# Patient Record
Sex: Male | Born: 1985 | Race: Black or African American | Hispanic: No | Marital: Single | Smoking: Current every day smoker
Health system: Southern US, Community
[De-identification: ages and names within clinical notes are randomized; demographics above are authoritative.]

## PROBLEM LIST (undated history)

## (undated) DIAGNOSIS — C801 Malignant (primary) neoplasm, unspecified: Secondary | ICD-10-CM

## (undated) DIAGNOSIS — N289 Disorder of kidney and ureter, unspecified: Secondary | ICD-10-CM

## (undated) HISTORY — PX: BACK SURGERY: SHX140

---
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
## 2005-01-16 ENCOUNTER — Encounter: Payer: Self-pay | Admitting: Emergency Medicine

## 2005-01-16 ENCOUNTER — Ambulatory Visit: Payer: Self-pay | Admitting: Physical Medicine & Rehabilitation

## 2005-01-16 ENCOUNTER — Inpatient Hospital Stay (HOSPITAL_COMMUNITY): Admission: AD | Admit: 2005-01-16 | Discharge: 2005-01-22 | Payer: Self-pay | Admitting: Neurosurgery

## 2005-01-16 ENCOUNTER — Ambulatory Visit: Payer: Self-pay | Admitting: Oncology

## 2005-01-18 ENCOUNTER — Encounter (INDEPENDENT_AMBULATORY_CARE_PROVIDER_SITE_OTHER): Payer: Self-pay | Admitting: *Deleted

## 2005-01-22 ENCOUNTER — Inpatient Hospital Stay (HOSPITAL_COMMUNITY)
Admission: RE | Admit: 2005-01-22 | Discharge: 2005-01-26 | Payer: Self-pay | Admitting: Physical Medicine & Rehabilitation

## 2005-01-25 ENCOUNTER — Ambulatory Visit: Payer: Self-pay | Admitting: Oncology

## 2005-02-09 ENCOUNTER — Ambulatory Visit: Admission: RE | Admit: 2005-02-09 | Discharge: 2005-03-03 | Payer: Self-pay | Admitting: Radiation Oncology

## 2005-12-11 IMAGING — CR DG CHEST 2V
2 series · 2 of 2 positions shown · non-contrast
Comparison: none

CLINICAL DATA: Spinal mass and cord compression.  Now complains of numbness in legs.
 TWO VIEWS OF THE CHEST:

[view not recorded (1 of 2)]
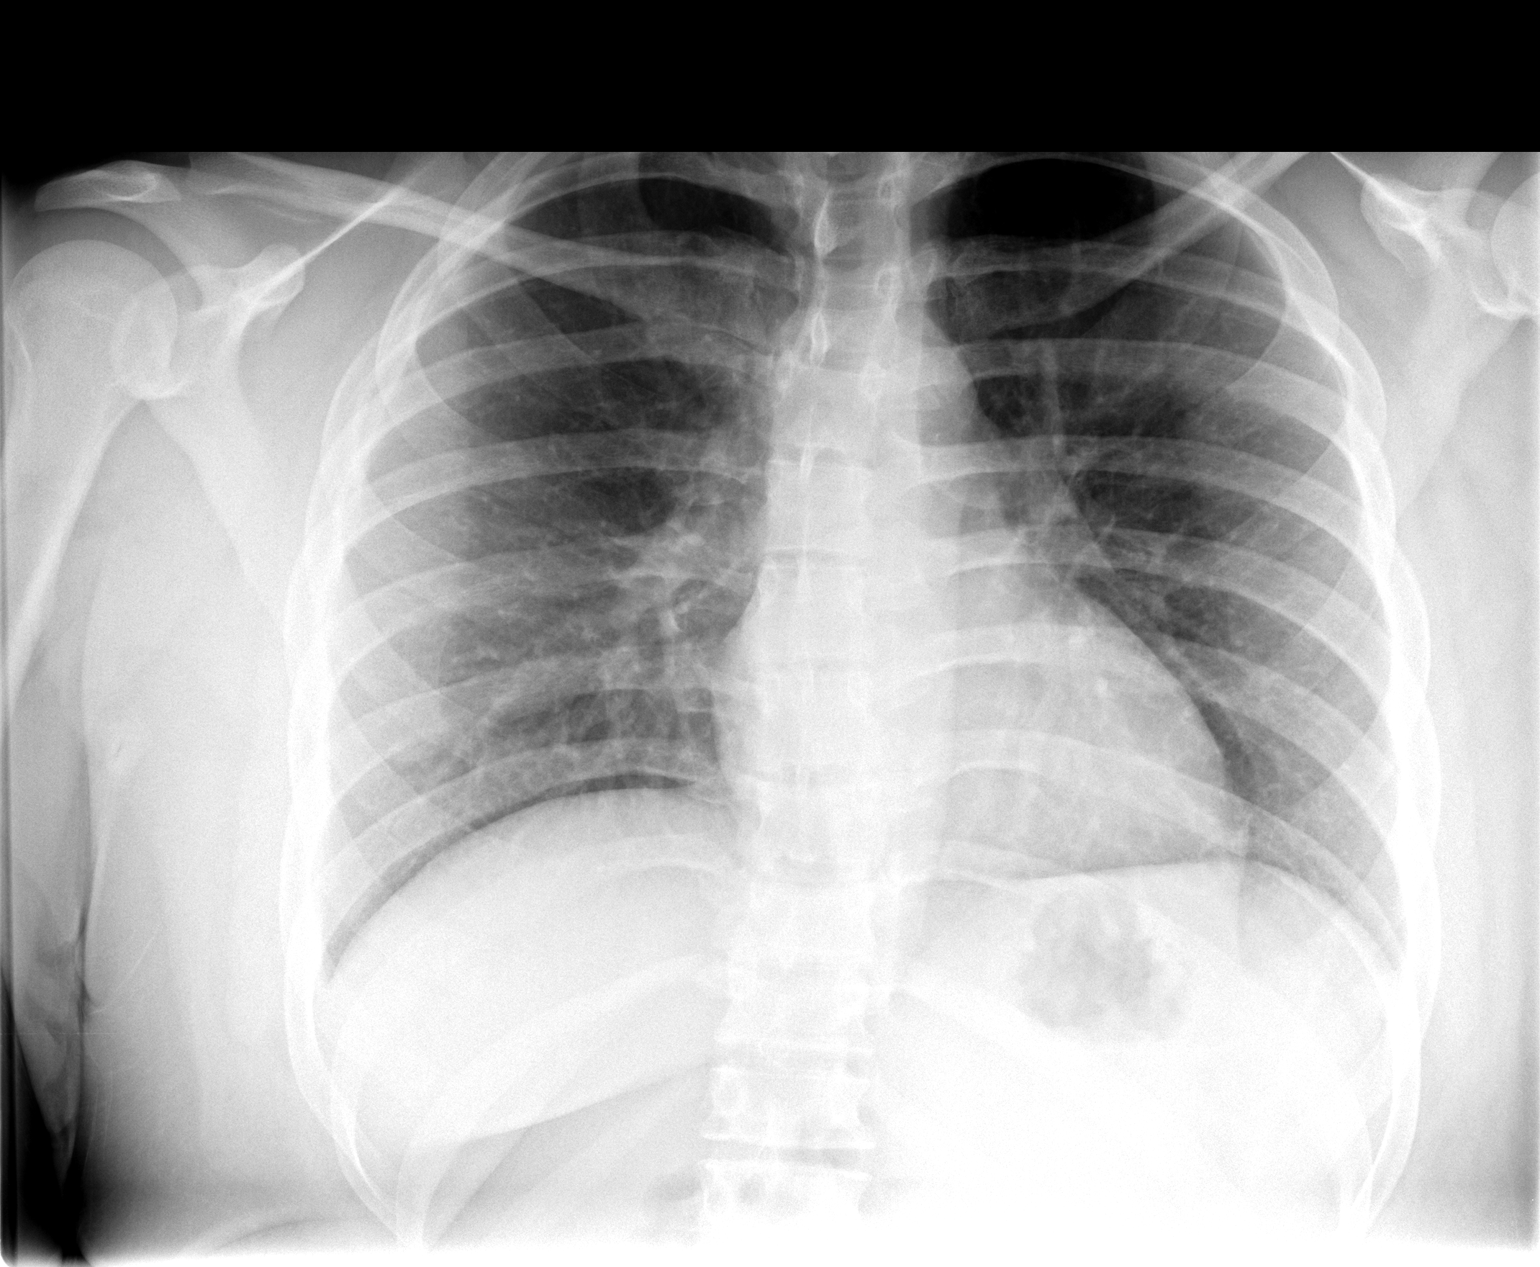

[view not recorded (2 of 2)]
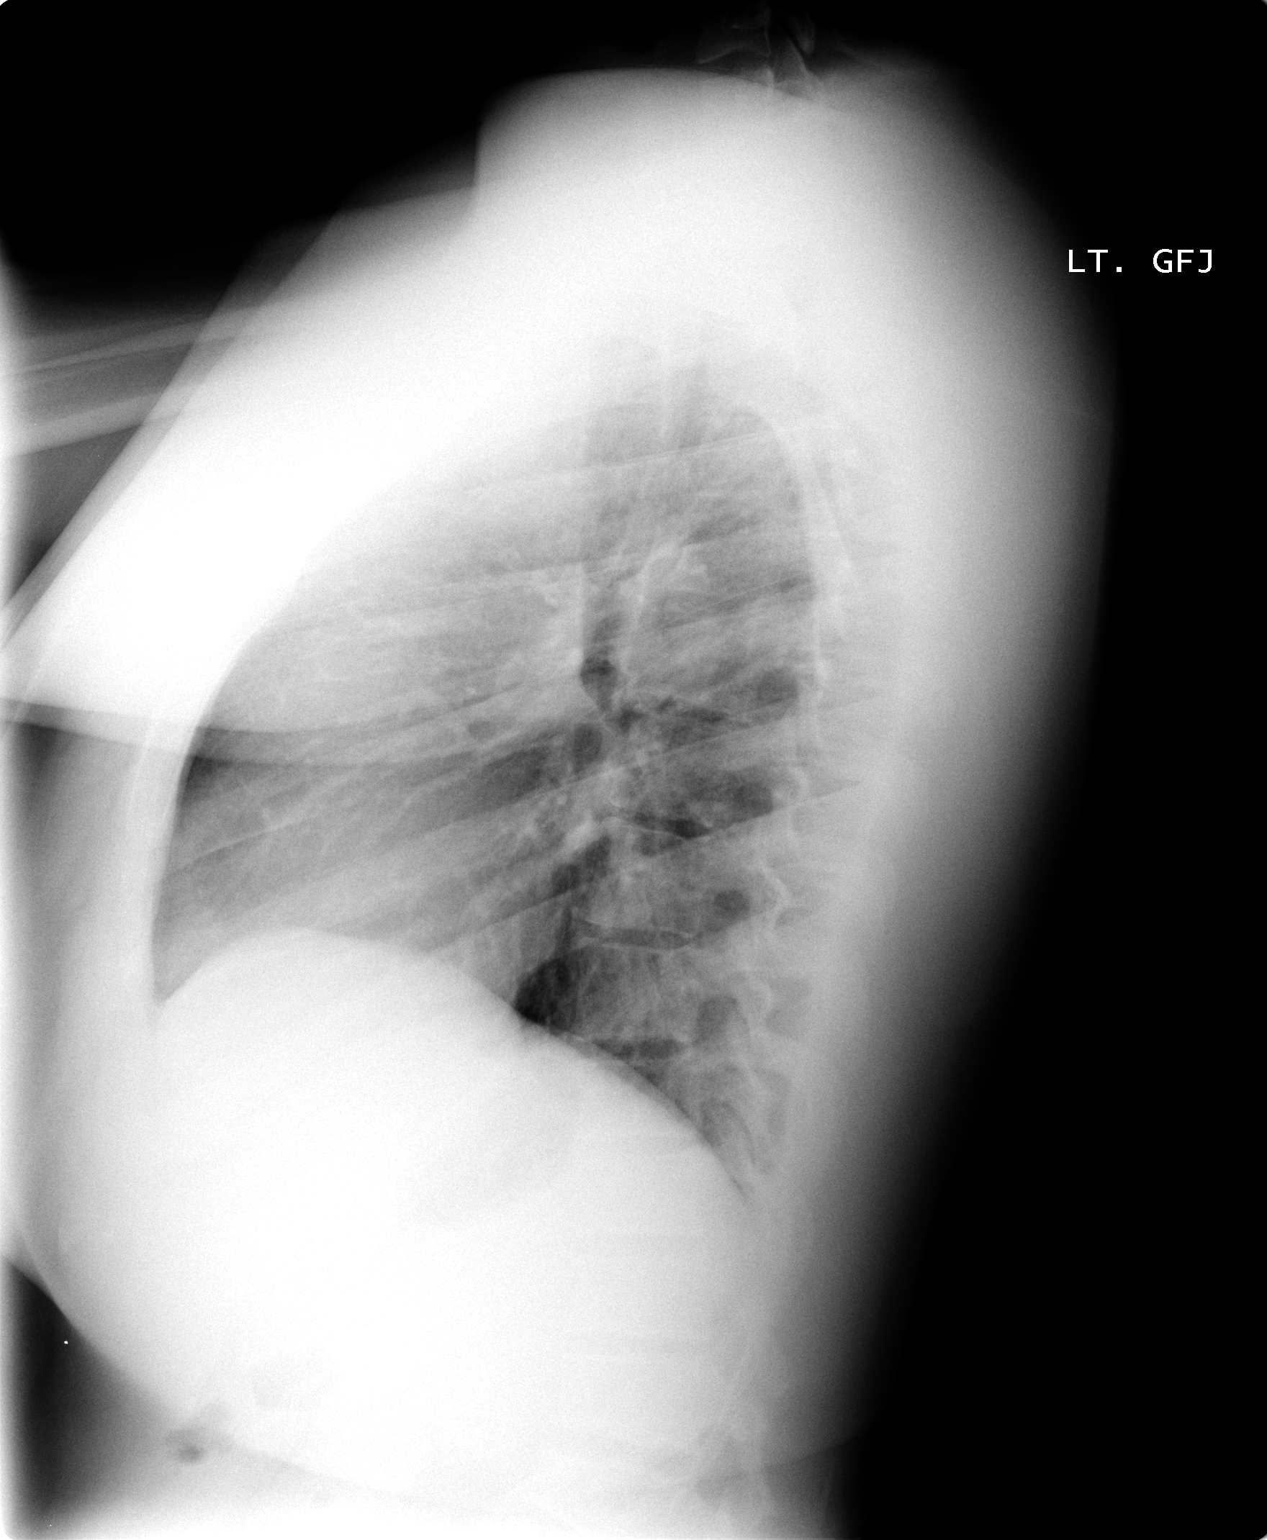

[2 of 2 positions shown; findings below may reference images not displayed]

FINDINGS: There are no focal lung opacities.  
 The heart size is normal.  No pleural effusions or pneumothorax.
IMPRESSION: Normal chest.

## 2009-01-18 ENCOUNTER — Emergency Department (HOSPITAL_COMMUNITY): Admission: EM | Admit: 2009-01-18 | Discharge: 2009-01-18 | Payer: Self-pay | Admitting: Emergency Medicine

## 2009-10-20 ENCOUNTER — Emergency Department (HOSPITAL_COMMUNITY): Admission: EM | Admit: 2009-10-20 | Discharge: 2009-10-20 | Payer: Self-pay | Admitting: Emergency Medicine

## 2011-03-09 LAB — URINALYSIS, ROUTINE W REFLEX MICROSCOPIC
Bilirubin Urine: NEGATIVE
Glucose, UA: NEGATIVE mg/dL
Hgb urine dipstick: NEGATIVE
Ketones, ur: NEGATIVE mg/dL
Nitrite: NEGATIVE
Protein, ur: NEGATIVE mg/dL
Specific Gravity, Urine: 1.021 (ref 1.005–1.030)
Urobilinogen, UA: 1 mg/dL (ref 0.0–1.0)
pH: 6.5 (ref 5.0–8.0)

## 2011-04-09 NOTE — Discharge Summary (Signed)
[name], [name]             ACCOUNT NO.:  [id number]   MEDICAL RECORD NO.:  [id number]          PATIENT TYPE:  INP   LOCATION:  3004                         FACILITY:  [facility]   PHYSICIAN:  Clydene Fake, M.D.  DATE OF BIRTH:  1986/06/26   DATE OF ADMISSION:  01/16/2005  DATE OF DISCHARGE:  01/22/2005                                 DISCHARGE SUMMARY   ADMISSION DIAGNOSIS:  Spinal mass.   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   PROCEDURES:  T10-L1 laminectomy and resection of epidural tumor and biopsy  and microdissection with microscope.   REASON FOR ADMISSION:  The patient is a 25 year old gentleman who is said to  have progressive back pain and leg numbness and weakness during walking.  He  went to the emergency room.  MRI demonstrated a large paraspinous mass from  T10 to L1 through the foramen into the epidural space compressing the spinal  cord  The patient was admitted and started on steroids and underwent surgery  on January 18, 2005, to decompress the cord, biopsy of tumor.  Surgery went  without complications.  Postop, the patient was sent to the recovery room  and intensive care unit and was watched.  He had no headaches.  Incision was  clean, dry and intact.  He showed grossly 5/5 strength in all motor groups  with rapid alternating movements down the legs pretty well.  Sensation  grossly intact which seems to be an improvement from preop.  Transferred  from ICU on February 28, and got Oncology and Radiation/Oncology consults.  First, we started getting the patient up out of bed.  Started moving around.  PT was consulted to work with him at assisting with that.  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  The patient was ambulating by March 2, and this was slightly  better than prior surgery, but still slightly spastic, but sensory intact,  and that is a great  improvement from preoperative.  Oncologist was going to  get records from [facility] where the patient was treated for a germ  cell tumor around the kidney when he was 25 years of age, and try to compare  pathology and consider further course of treatment when the final pathology  is back.  Rehabilitation was consulted, and they agreed the patient would be  a good rehab candidate  Incision is intact.  He is starting to do well, and  he was discharged from the hospital and transferred to rehabilitation on  January 22, 2005, in stable condition.  We will follow him while there.  His  outpatient oncologist will follow the patient.      JRH/MEDQ  D:  03/25/2005  T:  03/25/2005  Job:  [id number]

## 2011-04-09 NOTE — Consult Note (Signed)
[name], LAW             ACCOUNT NO.:  [id number]   MEDICAL RECORD NO.:  [id number]          PATIENT TYPE:  INP   LOCATION:  3004                         FACILITY:  [facility]   PHYSICIAN:  [name], M.D. DATE OF BIRTH:  1986-02-17   DATE OF CONSULTATION:  01/19/2005  DATE OF DISCHARGE:                                   CONSULTATION   REASON FOR CONSULT:  Rule out recurrent germ cell tumor.   HISTORY OF PRESENT ILLNESS:  [name] is a pleasant 25 year old, African-  American male with a history of germ cell tumor diagnosed at age 25 requiring  radiation and chemotherapy to the area at [facility], without recurrence  since that time until possibly now.  He presented to the ED with a several-  month history of back pain, worse over the last couple of weeks, and on  admission with progressive bilateral lower extremity weakness.  MRI of the  TL spine with and without contrast demonstrated a mass at the right  paraspinous area at T11-T12, measuring 3 x 3 cm, extending into the spinal  canal, with marked compression of the conus medullaris.  No bony invasion  identified.  He underwent a laminectomy - tumor resection on January 18, 2005 by Dr. Phoebe Perch.  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   PAST MEDICAL HISTORY:  1.  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  2.  Scoliosis.  3.  History of tobacco habituation.  4.  New onset of cord compression secondary to mass at the right paraspinous      area, prior to resection.   SURGERY:  Status post T10-L1 laminectomy and resection of epidural tumor by  Dr. Phoebe Perch, January 18, 2005.   ALLERGIES:  NKDA.   CURRENT MEDICATIONS:  1.  Cefazolin 1 g IV q.8 h.  2.  Decadron 2 mg p.o. q.6 h. and tapering.  3.  Colace b.i.d.  4.  Pepcid 20 mg b.i.d.  5.  Robaxin 500 mg q.6 h.  6.   Morphine sulfate 30 mg q.4 h. PCA.  7.  Vitamin E 800 mg b.i.d.  8.  Lortab q.4 h. p.r.n.   P.R.N. MEDICATIONS:  1.  Reglan.  2.  Narcan.  3.  Zofran.  4.  Senokot.  5.  Compazine.   REVIEW OF SYSTEMS:  See HPI for significant positives.  At this time, other  than bilateral lower extremity weakness, which is improved, and resolving  numbness and tingling, the rest of the review of systems is essentially  negative.   FAMILY HISTORY:  Mother and father both alive and well.  He has no sisters.  He has two brothers in good health.   SOCIAL HISTORY:  The patient is single.  He has no children.  He is a  Consulting civil engineer at [employer], and he also works at [employer].  He smokes one to two  cigarettes a day for the last couple of years.  No  alcohol history.  He  lives in [place].   PHYSICAL EXAMINATION:  GENERAL:  This is a moderately obese 25 year old,  African-American male in no acute distress.  Alert and oriented x 3.  He is  lying on the left side of the bed.  BLOOD PRESSURE:  120/50.  PULSE:  40.  RESPIRATIONS:  20.  TEMPERATURE:  98.  HEENT:  Normocephalic, atraumatic.  PERRLA.  Oral mucosa without thrush or  lesions.  NECK:  Supple.  No JVD.  No cervical or supraclavicular masses.  CARDIOVASCULAR:  Regular rate and rhythm, without murmurs, rubs, or gallops.  ABDOMEN:  Soft, nontender.  Bowel sounds x 4.  No palpable spleen or liver.  GENITOURINARY/RECTAL:  Testes without masses.  There is a urinary catheter  in place.  Urine clear.  EXTREMITIES:  With no clubbing or cyanosis, no edema.  SKIN:  Reveals no lesions, although very dry.  No bruising or petechiae.  NEUROLOGIC:  Other than improving numbness and tingling, as mentioned above,  exam as per neurosurgery.  Appears improving symptoms.  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   LABORATORIES:  Hemoglobin 14.5, hematocrit 42.5, white count 8.9, platelets  167, MCV 81.2.  PT 12.7, PTT 30,  INR 0.9.  Sodium 134, potassium 3.9, BUN  12, creatinine 0.9, glucose 91, total bilirubin 0.6, alkaline phosphatase  79, AST 33, ALT 41, total protein 7.1, albumin 3.8, calcium 9.4.  Urinalysis  shows proteinuria as of January 16, 2005, greater than 300.  He also has  shown in that urinalysis moderate hematuria.  Alpha fetoprotein pending.   ASSESSMENT AND PLAN:  Dr. Truett Yoder has seen and evaluated the patient, and  the chart has been reviewed.   IMPRESSION:  1.  Right paraspinous mass with cord compression, status post laminectomy -      tumor resection.  2.  Bilateral lower extremity weakness - numbness, improved.  3.  Remote history of right kidney tumor at age 25, treated at [facility].  4.  History of tobacco use.   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  We will follow up  on the final pathology and then make treatment recommendations.   RECOMMENDATIONS:  1.  Radiation oncology consult.  2.  Staging CT scans of the brain, chest, abdomen, and pelvis.  3.  Alpha fetoprotein, beta HCG.  4.  Postoperative care, Decadron taper per neurosurgery.  5.  Treatment recommendations to following pending the final pathology.      Staging evaluation.  6.  Obtain records from [facility] as soon as possible.   Thank you very much for allowing [place] the opportunity to participate in the  care of [name].      SW/MEDQ  D:  01/20/2005  T:  01/20/2005  Job:  [id number]

## 2011-04-09 NOTE — Discharge Summary (Signed)
[name], [name]             ACCOUNT NO.:  [id number]   MEDICAL RECORD NO.:  [id number]          PATIENT TYPE:  IPS   LOCATION:  4036                         FACILITY:  [facility]   PHYSICIAN:  Erick Colace, M.D.DATE OF BIRTH:  1986-07-19   DATE OF ADMISSION:  01/22/2005  DATE OF DISCHARGE:  01/26/2005                                 DISCHARGE SUMMARY   DISCHARGE DIAGNOSES:  1.  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  2.  Pain control.  3.  History of germ cell tumor age 74 right kidney with chemotherapy therapy,      radiation therapy.  4.  Tobacco abuse.   HISTORY OF PRESENT ILLNESS:  A 25 year old black male.  History of germ cell  tumor right kidney age 74 treated with chemotherapy and radiation.  Admitted  February 25 with back pain, bilateral leg weakness.  MRI February 24 mass  right paraspinous area T10-L1 through the foramen to the central canal.  Underwent T10-L1 laminectomy epidural tumor resection February 27 per Dr.  Phoebe Perch.  Hematology/oncology consulted Dr. Truett Perna.  Pathology report  impression of recurrent germ cell tumor.  Plan was to discuss care with [facility] Dr. Doylene Canning to decide course of care.  He had no bowel or bladder  disturbances.  He was admitted for comprehensive rehabilitation program.   PAST MEDICAL HISTORY:  See discharge diagnoses.   ALLERGIES:  None.   SOCIAL HISTORY:  He does have a history of tobacco use.  Denies alcohol.   MEDICATIONS PRIOR TO ADMISSION:  None.   PAST MEDICAL HISTORY:  Negative other than discharge diagnoses.   HOSPITAL COURSE:  Patient with progressive gains while on rehabilitation  services with therapies initiated on a b.i.d. basis.  The following issues  were followed during patient's rehabilitation course. Pertaining to Mr.  Makela recurrent germ cell tumor, he had undergone T10-L1 laminectomy  with tumor resection February 27.  Surgical site  healing nicely.  Sutures in  place.  He would follow up with Dr. Phoebe Perch.  He remained afebrile.  Pain  control ongoing with the use of Vicodin and good results.  He was  independent, walking throughout the rehabilitation unit.  His strength was  grossly graded a 4+/5.  Oncology had been contacted per Dr. Truett Perna to [facility], Dr. Doylene Canning who was the patient's primary oncologist from history of  germ cell tumor at age 74.  Plan was to follow up Dr. Doylene Canning on February 01, 2005  to decide plan of care.  He would follow up Dr. Mancel Bale on March 17 to  carry out his treatment as recommended per Dr. Doylene Canning in [place].  During  his rehabilitation stay he had no bowel or bladder disturbances.  Blood  pressures controlled.  He did have a history of tobacco abuse.  He had not  requested any nicotine products.  It was discussed at length the need for  cessation of smoking.   LABORATORIES:  Sodium 136, potassium 4.1, BUN 13, creatinine 0.8, hemoglobin  14.3, hematocrit 41.6.  DISCHARGE MEDICATIONS:  1.  Vicodin as needed pain.  2.  Vitamin E 800 units twice daily.   ACTIVITY:  As tolerated.   DIET:  Regular.   SPECIAL INSTRUCTIONS:  Follow up Dr. Doylene Canning [facility] oncology March 13,  Dr. Mancel Bale oncology services [facility] March 17 to carry  out plan of care for recurrent germ cell tumor.      DA/MEDQ  D:  01/26/2005  T:  01/26/2005  Job:  [id number]   cc:   Clydene Fake, M.D.  [address]  [place]  [place] [address]  Fax: [phone]   Leighton Roach. Truett Perna, M.D.  [address] [facility]  [place]  [place] [address]  Fax: [phone]

## 2011-04-09 NOTE — H&P (Signed)
[name], [name]             ACCOUNT NO.:  [id number]   MEDICAL RECORD NO.:  [id number]          PATIENT TYPE:  INP   LOCATION:  3003                         FACILITY:  [facility]   PHYSICIAN:  [name], M.D.  DATE OF BIRTH:  12/24/1985   DATE OF ADMISSION:  01/16/2005  DATE OF DISCHARGE:                                HISTORY & PHYSICAL   CHIEF COMPLAINT:  Back pain, leg weakness.   HISTORY:  The patient is a 25 year old gentleman who had some back pain in  hind site now over the last few months but it has been worsening over the  last couple of weeks. It has been more of a problem and he has had  associated progressive weakness in his legs with trouble walking, and some  numbness and tingling in his legs, right side revealed to be a little worse  than the left but both are involved.  He now presents to the emergency room  on January 15, 2005 and worked up with MRI of the thoracolumbar spine with  and without contrast and found to have a mass in the right paraspinous area  going through the foramen into the central canal, progressed through the  spinal cord. The patient's history is significant for a germ cell tumor  starting in the right kidney when he was 69-years-old which was treated with  chemotherapy and radiation. No surgery though I assume they must have done  some kind of biopsy.  Again, that was 17 years ago. It was felt that his  right kidney dissolved during all of this treatment.  No bowel or bladder  changes.   PAST MEDICAL HISTORY:  Significant for germ cell tumor in the right kidney  at age 11 as mentioned above, otherwise negative.   PAST SURGICAL HISTORY:  None.   ALLERGIES:  No known drug allergies.   MEDICATIONS:  Advil or Tylenol p.r.n.   SOCIAL HISTORY:  Shows he is 33, lives with parents. He is a positive  smoker. No drug or alcohol use.   FAMILY HISTORY:  Noncontributory.   REVIEW OF SYSTEMS:  Negative for any other problems.   PHYSICAL  EXAMINATION:  HEENT:  Unremarkable, normal. Normocephalic.  NECK:  Supple, nontender.  CHEST:  Clear.  ABDOMEN:  Soft, nontender.  EXTREMITIES:  Intact, no edema.  NEURO EXAM:  He is awake, alert and oriented x3. Appropriate affect. Normal  fundi.  Cranial nerves II-XII are examined and are intact. Motor strength and  sensation of upper extremities along with RFL movements are done well and  with no pronator drift. The lower extremities have 2/4 reflexes, equal at  the knees and ankles. No clonus. Toes are equivocal but sensation is  decreased in the entire legs. There is a sensory level on the right up  around T12 or so, on the left, at 1 out of 2, just slightly lower. Gait is  done on a wide base, spastic. He cannot tandem gait. Wrap around movements  are slowed in the lower extremities.   DATA REVIEW:  MRI: Shows an enhancing mass adjacent to the paraspinal space  on the right, anterior and posterior to the rib with this mass extending  into the foramen at T11-12, also possibly at 10-11 and to the central canal.  A large tumor mass in the canal compressing on the spinal cord significantly  to the left with this mass taking up about 90% of the canal space.  Extends  from 10 to T12. All these structures seem to be intact, this mass surrounds  them.   ASSESSMENT/PLAN:  History of germ cell tumor in right kidney as a child and  now very close to that area this new mass that has now extended into the  central canal compressing the spinal cord.   The patient is going to be admitted, placed on some Decadron p.o. medication  p.r.n. Again, preop work up and labs. Will consult oncology. Will try to get  records from [facility] but this patient is going to need surgical  intervention. He is going to  possibly need resection of this tumor in the  paraspinal space.  I am going to discuss the plan with oncology so we have  an idea of high aggressive we may need to be in surgical resection of  this.  As we finalize our plan we will continue discussion with the patient and  family.  The patient agrees with the plan.      JRH/MEDQ  D:  01/16/2005  T:  01/16/2005  Job:  [id number]

## 2011-04-09 NOTE — H&P (Signed)
NAMEJAYVIAN, [name]             ACCOUNT NO.:  [id number]   MEDICAL RECORD NO.:  [id number]          PATIENT TYPE:  IPS   LOCATION:  4036                         FACILITY:  [facility]   PHYSICIAN:  Erick Colace, M.D.DATE OF BIRTH:  1986-03-12   DATE OF ADMISSION:  01/22/2005  DATE OF DISCHARGE:                                HISTORY & PHYSICAL   CHIEF COMPLAINT:  Weakness in the legs.   A 25 year old black male with a history of germ cell tumor in right kidney  at age 15, treated with chemo and radiation therapy.  He was admitted, on  January 16, 2005, with back pain and bilateral lower extremity weakness.  MRI demonstrated a mass in the right paraspinous area T10-L1 extending  through the neuroforamen of T11-T12 and expanding epidural space-occupying  lesion in the conus medullares area.  He underwent a T10-L1 laminectomy and  epidural tumor resection per Dr. Phoebe Perch on January 18, 2005. Hem/onc Dr.  Truett Perna is active on the case and the plan is for chemotherapy plus  probable radiation therapy.  Further workup hinges upon path report.  The  patient denies any bowel or bladder problems.   REVIEW OF SYSTEMS:  Positive for weakness in the lower extremities, lumbago.  No GI disturbance.  No respiratory problems.   PAST MEDICAL HISTORY:  As noted above, germ cell tumor age 15 right kidney.   SOCIAL HISTORY:  He lives with his mother in [place], was employed at  [employer].  Family works during the day, one level home, two steps to enter,  positive history of tobacco, negative for ETOH.   FUNCTIONAL HISTORY:  Independent prior to admission.   CURRENT FUNCTIONAL STATUS:  Min assist for bed mobility, mod assist  transfers, min guard 350 feet with rolling walker.   MEDICATIONS PRIOR TO ADMISSION:  None.   CURRENT MEDICATIONS:  1.  Pepcid 20 mg b.i.d.  2.  Vitamin E 1800 units b.i.d.  3.  Colace 100 mg b.i.d.  4.  Robaxin 500 p.o. q.6h. p.r.n.  5.  Vicodin 1-2 p.o. q.4h.  p.r.n.  6.  Tylenol p.r.n.  7.  Trazodone q.h.s. p.r.n.   ALLERGIES:  None.   LAST LABS:  Hemoglobin 14.5, platelets 167,000, white count 8.9.  BUN 12,  creatinine 0.9, sodium 134, potassium 3.9.   PHYSICAL EXAMINATION:  VITAL SIGNS:  Blood pressure 120/70, pulse 80, temp  98, respirations 18.  GENERAL:  Obese, young male in no acute distress.  EYES:  Not injected.  Extraocular muscles intact.  NECK:  Supple without adenopathy.  EARS:  Hearing is intact.  LUNGS:  Clear.  HEART:  Regular rate and rhythm.  BACK:  A long midline incision closed with sutures, clean, no erythema, no  drainage.  EXTREMITIES:  Lower extremity pulses are normal.  No clubbing, cyanosis, or  edema.  He has 3-/5 bilateral hip flexor strength, otherwise 5/5 bilateral  upper extremities, 5/5 bilateral quads and ankle dorsiflexors.  Sensation is  intact in bilateral upper and lower extremities for light touch.  ABDOMEN:  Positive bowel sounds.  Soft, nontender palpation.  NEUROLOGIC:  Oriented x 3.  Memory appears intact.  Mood and affect are  appropriate.   IMPRESSION:  1.  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  2.  History of germ cell tumor.   PAIN MANAGEMENT:  Continue Vicodin and Robaxin,  may need to add a longer-  acting medicine such as OxyContin if needed.   DEEP VEIN THROMBOSIS PROPHYLAXIS:  Knee high TED's.  We will add foot pumps.   ESTIMATED LENGTH OF STAY:  Five to seven days.   The patient is a good rehab candidate.  Prognosis for functional improvement  is good at least in the short term, also hinges upon treatment of underlying  epidural mass.      AEK/MEDQ  D:  01/22/2005  T:  01/22/2005  Job:  [id number]   cc:   Clydene Fake, M.D.  [address]  [place]  [place] [address]  Fax: [phone]   Leighton Roach. Truett Perna, M.D.  [address] [facility]  [place]  [place] [address]  Fax: [phone]

## 2011-04-09 NOTE — Op Note (Signed)
NAMEFACUNDO, [name]             ACCOUNT NO.:  [id number]   MEDICAL RECORD NO.:  [id number]          PATIENT TYPE:  INP   LOCATION:  3172                         FACILITY:  [facility]   PHYSICIAN:  Clydene Fake, M.D.  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   POSTOPERATIVE DIAGNOSIS:  Thoracic tumor with cord compression and  myelopathy.   PROCEDURE:  T10-L1 laminectomy and resection of epidural tumor,  microdissection with microscope.   SURGEON:  Clydene Fake, M.D.   ASSISTANT:  Elder.   ANESTHESIA:  General endotracheal tube anesthesia.   ESTIMATED BLOOD LOSS:  250 mL.   BLOOD GIVEN:  None.   DRAINS:  None.   COMPLICATIONS:  None.   SPECIMENS:  Tumor sent for frozen and permanent.  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   REASON FOR PROCEDURE:  The patient is a 25 year old gentleman who was sent  for progressive back pain and leg numbness and weakness and trouble walking,  who when he was 25 years of age had a tumor around the right kidney.  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  MRI was done and it was back and found to have a large  paraspinous mass from T10 to L1 that invaded through the foramen into the  epidural space, compressing the spinal cord, worse at T11-12, and the  patient brought in for a laminectomy and decompression and tumor resection.   PROCEDURE IN DETAIL:  The patient was brought in the operating room and  general anesthesia was induced.  The patient was placed in a prone position  on a Wilson frame with all pressure points padded.  The patient was prepped  and draped in sterile fashion.  The site of incision was injected with 20 mL  1% lidocaine with epinephrine.  An incision was then made in the midline of  the thoracolumbar spine and  the incision taken down to the spinous process.  Subperiosteal dissection was done on the right side over three spinous  processes.  Markers were placed in two interspaces, thought to be T11-12 and  T12-L1 and x-rays obtained confirming our position.  The markers were at  those levels.  We then extended our incision just slightly cephalad and  caudally and did a bilateral subperiosteal dissection over T10, T11, T12 and  L1 spinous processes and laminae out to the facets on the right side,  carried the dissection out to the L1 transverse process and the T12 and T11  ribs.  We then performed a laminectomy using Leksell rongeurs, high-speed  drill and then Kerrison punches, removing the bottom of T12, the T11 spinous  process and lamina, T12 and the top of L1.  Dissection was taken a little  further laterally on the right side.  Once we had the central decompression,  we could see the tumor centered at T11, compressing the dura and cord.  It  was pushing it to the right.  We worked on decompression using pituitary  rongeurs, bipolar cauterization and the CUSA to remove tumor.  We kept  hemostasis in the capsule with bipolar cauterization.  We were able to  centrally decompress and then bring the capsule up and remove tumor,  decompressing the central canal in this manner.  We never did appreciate a  nerve root coming out of the T11-12 foramen.  There was a root coming out of  the T10-11 foramen, but tumor was stuck to the root entry zone and when  trying to remove the tumor, a small dural rent occurred.  This was repaired  primarily with 6-0 Prolene suture.  Because the tumor was stuck to this  nerve root as it was coming out the foramen, the nerve root was sacrificed  using a suture and bipolar cauterization, and this allowed [place] to  appropriately decompress the right side of the canal.  We made our  dissection down inferiorly also, where it appears that we had good central  decompression.   The canal had the tumor removed.  The dura and spinal cord  still were positioned toward the left side of the canal, though there was no  pressure.  The cord was pulsatile at this point.  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  It was  felt safer that now that we have diagnosis and the spinal cord decompressed  to stop and not do any further decompression or tumor resection.  Hemostasis  was obtained with Gelfoam and thrombin.  Some Tisseel tissue glue was placed  over the sutured dural rent, but prior to that Valsalva maneuver was done  showing no CSF leak from that area.  Hemostasis was obtained with the  bipolar Bovie cauterization and the paraspinous muscles and then the fascia  closed with 0 Vicryl interrupted suture, the subcutaneous tissue closed with  0, 2-0 and 3-0 Vicryl interrupted suture, and the skin closed with a running  3-0 nylon suture.  When that was done, a dressing was placed.  The patient  was placed back to a supine position, awoken from anesthesia, and  transferred to the recovery room in stable condition.      JRH/MEDQ  D:  01/18/2005  T:  01/18/2005  Job:  [id number]

## 2013-04-02 ENCOUNTER — Emergency Department (INDEPENDENT_AMBULATORY_CARE_PROVIDER_SITE_OTHER)
Admission: EM | Admit: 2013-04-02 | Discharge: 2013-04-02 | Disposition: A | Discharge status: Home / Self Care | Payer: Medicare Other | Source: Home / Self Care | Attending: Family Medicine | Admitting: Family Medicine

## 2013-04-02 ENCOUNTER — Encounter (HOSPITAL_COMMUNITY): Payer: Self-pay | Admitting: Emergency Medicine

## 2013-04-02 DIAGNOSIS — N342 Other urethritis: Secondary | ICD-10-CM

## 2013-04-02 DIAGNOSIS — Z113 Encounter for screening for infections with a predominantly sexual mode of transmission: Secondary | ICD-10-CM | POA: Insufficient documentation

## 2013-04-02 HISTORY — DX: Disorder of kidney and ureter, unspecified: N28.9

## 2013-04-02 HISTORY — DX: Malignant (primary) neoplasm, unspecified: C80.1

## 2013-04-02 LAB — POCT URINALYSIS DIP (DEVICE)
Bilirubin Urine: NEGATIVE
Glucose, UA: NEGATIVE mg/dL
Ketones, ur: NEGATIVE mg/dL
Nitrite: NEGATIVE
Protein, ur: 100 mg/dL — AB
Specific Gravity, Urine: 1.025 (ref 1.005–1.030)
Urobilinogen, UA: 0.2 mg/dL (ref 0.0–1.0)
pH: 6.5 (ref 5.0–8.0)

## 2013-04-02 MED ORDER — LIDOCAINE HCL (PF) 1 % IJ SOLN
INTRAMUSCULAR | Status: AC
  Filled 2013-04-02: qty 5

## 2013-04-02 MED ORDER — CEFTRIAXONE SODIUM 1 G IJ SOLR
1.0000 g | Freq: Once | INTRAMUSCULAR | Status: AC
  Administered 2013-04-02: 1 g via INTRAMUSCULAR

## 2013-04-02 MED ORDER — AZITHROMYCIN 250 MG PO TABS
ORAL_TABLET | ORAL | Status: AC
  Filled 2013-04-02: qty 4

## 2013-04-02 MED ORDER — CEFTRIAXONE SODIUM 1 G IJ SOLR
INTRAMUSCULAR | Status: AC
  Filled 2013-04-02: qty 10

## 2013-04-02 MED ORDER — AZITHROMYCIN 250 MG PO TABS
1000.0000 mg | ORAL_TABLET | Freq: Once | ORAL | Status: AC
  Administered 2013-04-02: 1000 mg via ORAL

## 2013-04-02 NOTE — Discharge Instructions (Signed)
You have been empirically treated today for gonorrhea and Chlamydia. Also the injection today is an antibiotic that can help resolve urinary tract infections. We will contact you if any of the pending tests results if abnormal. 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 Go to the emergency department if worsening symptoms like abdominal pain or back pain, new onset of fever despite, nausea or vomiting etc.following treatment.

## 2013-04-02 NOTE — ED Notes (Signed)
Patient aware that post injection discharge delay.  Understands  Reasoning for policy

## 2013-04-02 NOTE — ED Notes (Signed)
Painful urination, onset 2-3 days ago.  No history of the same.  Reports penile discharge this am

## 2013-04-02 NOTE — ED Notes (Signed)
Given graham crackers/peanut butter and ice water

## 2013-04-02 NOTE — ED Provider Notes (Signed)
History     CSN: [id number]  Arrival date & time 04/02/13  1249   First MD Initiated Contact with Patient 04/02/13 1337      Chief Complaint  Patient presents with  . 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 Here complaining of burning on urination and urinary frequency during the last 3 days. Had discharge from penis this morning. States last unprotected sex was 1 month ago. No groin pain, no testicular pain or mass. Denies fever or chills. No back or flank pain. No abdominal pain. No nausea or vomiting. States he's otherwise feeling well.   Past Medical History  Diagnosis Date  . Cancer   . 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 Back surgery      No family history on file.  History  Substance Use Topics  . Smoking status: Current Every Day Smoker  . Smokeless tobacco: Not on file  . Alcohol Use: Yes      Review of Systems  Unable to perform ROS Constitutional: Negative for fever, chills, diaphoresis, activity change, appetite change, fatigue and unexpected weight change.  Gastrointestinal: Negative for nausea, vomiting, abdominal pain and diarrhea.  Genitourinary: Positive for dysuria, frequency and discharge. Negative for hematuria, flank pain, scrotal swelling and testicular pain.  Musculoskeletal: Negative for back pain.  Skin: Negative for rash.  Neurological: Negative for dizziness and headaches.  All other systems reviewed and are negative.    Allergies  Review of patient's allergies indicates not on file.  Home Medications  No current outpatient prescriptions on file.  BP 145/81  Pulse 63   Temp(Src) 97.8 F (36.6 C) (Oral)  Resp 16  SpO2 96%  Physical Exam  Nursing note and vitals reviewed. Constitutional: He is oriented to person, place, and time. He appears well-developed and well-nourished. No distress.  HENT:  Head: Normocephalic and atraumatic.  Cardiovascular: Normal heart sounds.   Pulmonary/Chest: Breath sounds normal.  Abdominal: Soft. He exhibits no distension and no mass. There is no tenderness. There is no rebound and no guarding.  No CVT  Genitourinary: Testes normal. Circumcised. No phimosis. Discharge found.  Lymphadenopathy:       Right: No inguinal adenopathy present.       Left: No inguinal adenopathy present.  Neurological: He is alert and oriented to person, place, and time.  Skin: He is not diaphoretic.    ED Course  Procedures (including critical care time)  Labs Reviewed  POCT URINALYSIS DIP (DEVICE) - Abnormal; Notable for the following:    Hgb urine dipstick MODERATE (*)    Protein, ur 100 (*)    Leukocytes, UA TRACE (*)    All other components within normal limits  URINE CULTURE  CERVICOVAGINAL ANCILLARY ONLY   No results found.   1. Urethritis       MDM  Treated with Rocephin 1 g IM x1 and a azithromycin 1 g oral x1. GC Chlamydia and Trichomonas pending at the time of discharge. Also urine was sent for culture. Patient was instructed to followup with the urologist and a referral/contact information provided if persistent or recurrent symptoms. Was also asked to go to the emergency department if worsening symptoms like new onset  of fever, abdominal pain or back pain, general malaise or vomiting. Supportive care and red flags that should prompt his return to medical attention discussed with patient and provided in writing.        Sharin Grave, MD 04/02/13 1448

## 2013-04-03 ENCOUNTER — Other Ambulatory Visit (HOSPITAL_COMMUNITY)
Admission: RE | Admit: 2013-04-03 | Discharge: 2013-04-03 | Disposition: A | Discharge status: Home / Self Care | Payer: Medicare Other | Source: Ambulatory Visit | Attending: Family Medicine | Admitting: Family Medicine

## 2013-04-03 LAB — URINE CULTURE
Colony Count: NO GROWTH
Culture: NO GROWTH

## 2013-04-04 NOTE — ED Notes (Signed)
GC pos., Chlamydia neg., Trich pos.  Pt. adequately treated for GC with Rocephin and Zithromax.  Message to Dr. Alfonse Ras for further order for Trich.  DHHS form completed and faxed to the [facility]. Vassie Moselle 04/04/2013

## 2013-04-06 ENCOUNTER — Telehealth (HOSPITAL_COMMUNITY): Payer: Self-pay | Admitting: *Deleted

## 2013-04-06 MED ORDER — METRONIDAZOLE 500 MG PO TABS: 500.0000 mg | ORAL_TABLET | Freq: Two times a day (BID) | ORAL | Status: AC

## 2013-04-06 NOTE — ED Notes (Signed)
I called pt. Pt. verified x 2 and given results.  Pt. told he was adequately treated for GC with medicines he got here, and he needs Flagyl for Trich.   Pt. instructed to no alcohol while taking this medication.  Pt. instructed to notify his partner to be treated for both, no sex until he finishes the Flagyl and his partner has finished her treatment and to practice safe sex. Pt. told they can get HIV testing at the [facility]. STD clinic, by appointment.  Pt. wants Rx. sent to the [facility] on [address]  Dr. Tressia Danas e-prescribed it there. Vassie Moselle 04/06/2013

## 2013-10-06 ENCOUNTER — Emergency Department (INDEPENDENT_AMBULATORY_CARE_PROVIDER_SITE_OTHER)
Admission: EM | Admit: 2013-10-06 | Discharge: 2013-10-06 | Disposition: A | Discharge status: Home / Self Care | Payer: Medicare Other | Source: Home / Self Care | Attending: Emergency Medicine | Admitting: Emergency Medicine

## 2013-10-06 ENCOUNTER — Encounter (HOSPITAL_COMMUNITY): Payer: Self-pay | Admitting: Emergency Medicine

## 2013-10-06 DIAGNOSIS — H109 Unspecified conjunctivitis: Secondary | ICD-10-CM

## 2013-10-06 MED ORDER — ERYTHROMYCIN 5 MG/GM OP OINT: TOPICAL_OINTMENT | OPHTHALMIC | Status: AC

## 2013-10-06 NOTE — ED Provider Notes (Signed)
CSN: [id number]     Arrival date & time 10/06/13  1340 History   First MD Initiated Contact with Patient 10/06/13 1445     Chief Complaint  Patient presents with  . Eye Problem   (Consider location/radiation/quality/duration/timing/severity/associated sxs/prior Treatment) HPI Patient is a 27 yo M presenting with 2 days of left eye redness, swelling and tearing. Not painful, small amount of itching. He has tried allergy drop which did not help at all. He states he has a history of styes and thought it was the same. He endorses crusting of eye this morning. No known foreign objects in eye. No fevers, otherwise feels well.  Vision is not affected. Current every day smoker.   Past Medical History  Diagnosis Date  . Cancer   . 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 Back surgery     History reviewed. No pertinent family history. History  Substance Use Topics  . Smoking status: Current Every Day Smoker  . Smokeless tobacco: Not on file  . Alcohol Use: Yes    Review of Systems  Constitutional: Negative for fever and chills.  HENT: Negative for congestion.   Eyes: Positive for pain, discharge, redness and itching. Negative for visual disturbance.  Respiratory: Negative for cough and shortness of breath.   Cardiovascular: Negative for chest pain and leg swelling.  Gastrointestinal: Negative for abdominal pain.  Genitourinary: Negative for dysuria.  Musculoskeletal: Negative for arthralgias and myalgias.  Skin: Negative for rash.  Neurological: Negative for headaches.    Allergies  Review of patient's allergies indicates no known allergies.  Home Medications   Current Outpatient Rx  Name  Route  Sig  Dispense  Refill  . erythromycin ophthalmic ointment      Use 1/2 inch ribbon in left eye four times daily   3.5 g   0   . metroNIDAZOLE (FLAGYL) 500 MG tablet   Oral   Take 1  tablet (500 mg total) by mouth 2 (two) times daily.   14 tablet   0    BP 130/82  Pulse 58  Temp(Src) 97.8 F (36.6 C) (Oral)  Resp 16  SpO2 100% Physical Exam  Constitutional: He is oriented to person, place, and time. He appears well-developed and well-nourished. No distress.  HENT:  Head: Normocephalic and atraumatic.  Mouth/Throat: Oropharynx is clear and moist.  Eyes: Pupils are equal, round, and reactive to light. Lids are everted and swept, no foreign bodies found. Right eye exhibits no discharge and no exudate. No foreign body present in the right eye. Left eye exhibits discharge (clear persistent tearing). Left eye exhibits no exudate. No foreign body present in the left eye. Left conjunctiva is injected. Left conjunctiva has no hemorrhage. Right eye exhibits normal extraocular motion. Left eye exhibits normal extraocular motion.  Bulbar conjunctiva erythematous and swollen. Woods lamp negative for abrasion  Neck: Normal range of motion. Neck supple.  Cardiovascular: Normal rate, regular rhythm and normal heart sounds.   Pulmonary/Chest: Effort normal and breath sounds normal. No respiratory distress.  Abdominal: Soft. There is no tenderness.  Musculoskeletal: Normal range of motion. He exhibits no edema and no tenderness.  Lymphadenopathy:    He has no cervical adenopathy.  Neurological: He is alert and oriented to person, place, and time.  Skin: Skin is warm and dry.  Psychiatric: He has a normal mood and affect.  ED Course  Procedures (including critical care time) Labs Review Labs Reviewed - No data to display Imaging Review No results found.   MDM   1. Conjunctivitis    27 yo with left eye conjunctivitis viral vs. bacterial - Given Erythromycin opth ointment to use four times daily until symptoms resolved - Given [facility] number to call if he is not improving in one week  - Good hand hygiene - F/u as needed   Hilarie Fredrickson,  MD 10/06/13 1545

## 2013-10-06 NOTE — Discharge Instructions (Signed)

## 2013-10-06 NOTE — ED Provider Notes (Signed)
Medical screening examination/treatment/procedure(s) were performed by a resident physician and as supervising physician I was immediately available for consultation/collaboration.  Leslee Home, M.D.  Reuben Likes, MD 10/06/13 [phone]

## 2013-10-06 NOTE — ED Notes (Signed)
Patient states that he woke up 2 days ago with pain, redness and swelling of left eye, used OTC allergy eye drops with no relief

## 2016-07-14 DIAGNOSIS — D631 Anemia in chronic kidney disease: Secondary | ICD-10-CM | POA: Diagnosis not present

## 2016-07-14 DIAGNOSIS — R809 Proteinuria, unspecified: Secondary | ICD-10-CM | POA: Diagnosis not present

## 2016-07-14 DIAGNOSIS — N184 Chronic kidney disease, stage 4 (severe): Secondary | ICD-10-CM | POA: Diagnosis not present

## 2016-07-14 DIAGNOSIS — E559 Vitamin D deficiency, unspecified: Secondary | ICD-10-CM | POA: Diagnosis not present

## 2016-07-14 DIAGNOSIS — R319 Hematuria, unspecified: Secondary | ICD-10-CM | POA: Diagnosis not present

## 2016-07-14 DIAGNOSIS — N183 Chronic kidney disease, stage 3 (moderate): Secondary | ICD-10-CM | POA: Diagnosis not present

## 2023-01-04 ENCOUNTER — Ambulatory Visit (INDEPENDENT_AMBULATORY_CARE_PROVIDER_SITE_OTHER): Payer: Medicare Other

## 2023-01-04 ENCOUNTER — Encounter (HOSPITAL_COMMUNITY): Payer: Self-pay | Admitting: *Deleted

## 2023-01-04 ENCOUNTER — Ambulatory Visit (HOSPITAL_COMMUNITY)
Admission: EM | Admit: 2023-01-04 | Discharge: 2023-01-04 | Disposition: A | Discharge status: Home / Self Care | Payer: Medicare Other | Attending: Emergency Medicine | Admitting: Emergency Medicine

## 2023-01-04 ENCOUNTER — Other Ambulatory Visit: Payer: Self-pay

## 2023-01-04 DIAGNOSIS — M25522 Pain in left elbow: Secondary | ICD-10-CM

## 2023-01-04 DIAGNOSIS — F1290 Cannabis use, unspecified, uncomplicated: Secondary | ICD-10-CM

## 2023-01-04 NOTE — ED Provider Notes (Signed)
[place]    CSN: [id number] Arrival date & time: 01/04/23  [place]      History   Chief Complaint Chief Complaint  Patient presents with   Arm Pain    HPI [name] is a 37 y.o. male.   37 year old [name], presents to urgent care chief complaint of left elbow pain after trying to put up some blinds and being struck in the arm with blind.  Patient states he cannot take any pain meds as he is a cancer patient in remission.  Patient requesting x-ray, endorses smoking marijuana.  The history is provided by the patient. No language interpreter was used.    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 Authorizing Provider  erythromycin ophthalmic ointment Use 1/2 inch ribbon in left eye four times daily 10/06/13   Hairford, Tyler Pita, MD  metroNIDAZOLE (FLAGYL) 500 MG tablet Take 1 tablet (500 mg total) by mouth 2 (two) times daily. 04/06/13   Moreno-Coll, Adlih, MD    Family History History reviewed. No pertinent family history.  Social History Social History   Tobacco Use   Smoking status: Every Day  Substance Use Topics   Alcohol use: Yes     Allergies   Patient has no known allergies.   Review of Systems Review of Systems  Constitutional:  Negative for fever.  Musculoskeletal:  Positive for myalgias.  Skin:  Negative for color change and wound.  All other systems reviewed and are negative.    Physical Exam Triage Vital Signs ED Triage Vitals  Enc Vitals Group     BP 01/04/23 1654 (!) 147/88     Pulse Rate 01/04/23 1654 65     Resp 01/04/23  1654 18     Temp 01/04/23 1654 97.9 F (36.6 C)     Temp src --      SpO2 01/04/23 1654 98 %     Weight --      Height --      Head Circumference --      Peak Flow --      Pain Score 01/04/23 1652 8     Pain Loc --      Pain Edu? --      Excl. in [place]? --    No data found.  Updated Vital Signs BP (!) 147/88   Pulse 65   Temp 97.9 F (36.6 C)   Resp 18   SpO2 98%   Visual Acuity Right Eye Distance:   Left Eye Distance:   Bilateral Distance:    Right Eye Near:   Left Eye Near:    Bilateral Near:     Physical Exam Vitals and nursing note reviewed.  Constitutional:      General: He is not in acute distress.    Appearance: He is well-developed.  HENT:     Head: Normocephalic and atraumatic.  Eyes:     Conjunctiva/sclera: Conjunctivae normal.  Cardiovascular:     Rate and Rhythm: Normal rate and regular  rhythm.     Heart sounds: No murmur heard. Pulmonary:     Effort: Pulmonary effort is normal. No respiratory distress.     Breath sounds: Normal breath sounds.  Abdominal:     Palpations: Abdomen is soft.     Tenderness: There is no abdominal tenderness.  Musculoskeletal:        General: No swelling.     Left elbow: No swelling. Tenderness present in olecranon process.     Cervical back: Neck supple.  Skin:    General: Skin is warm and dry.     Capillary Refill: Capillary refill takes less than 2 seconds.  Neurological:     General: No focal deficit present.     Mental Status: He is alert and oriented to person, place, and time.     GCS: GCS eye subscore is 4. GCS verbal subscore is 5. GCS motor subscore is 6.  Psychiatric:        Attention and Perception: Attention normal.        Mood and Affect: Mood normal.        Speech: Speech normal.        Behavior: Behavior normal.      UC Treatments / Results  Labs (all labs ordered are listed, but only abnormal results are displayed) Labs Reviewed - No data to display  EKG   Radiology DG Elbow Complete  Left  Result Date: 01/04/2023 CLINICAL DATA:  Elbow pain EXAM: LEFT ELBOW - COMPLETE 3+ VIEW COMPARISON:  None Available. FINDINGS: There is no evidence of fracture, dislocation, or joint effusion. There is posterior elbow soft tissue swelling. Joint spaces are maintained. IMPRESSION: Posterior elbow soft tissue swelling. No acute fracture or dislocation. Electronically Signed   By: Ronney Asters M.D.   On: 01/04/2023 17:25    Procedures Procedures (including critical care time)  Medications Ordered in UC Medications - No data to display  Initial Impression / Assessment and Plan / UC Course  I have reviewed the triage vital signs and the nursing notes.  Pertinent labs & imaging results that were available during my care of the patient were reviewed by me and considered in my medical decision making (see chart for details).     Ddx: Contusion,bursitis, cellulitis, metastasis Final Clinical Impressions(s) / UC Diagnoses   Final diagnoses:  Left elbow pain  Marijuana smoker     Discharge Instructions      Your xray was negative for fracture. Rest,ice, elevate,wear ace wrap for comfort. Follow up with PCP. Return to Er for new or worsening issues.      ED Prescriptions   None    PDMP not reviewed this encounter.   Tori Milks, NP [id number]

## 2023-01-04 NOTE — Discharge Instructions (Addendum)
Your xray was negative for fracture. Rest,ice, elevate,wear ace wrap for comfort. Follow up with PCP. Return to Er for new or worsening issues.

## 2023-01-04 NOTE — ED Triage Notes (Signed)
Pt reports Lt arm pain and limited range of motion. Pt reports Sx's started 1-2 hrs ago. No injury

## 2023-08-28 ENCOUNTER — Emergency Department (HOSPITAL_COMMUNITY): Payer: 59

## 2023-08-28 ENCOUNTER — Inpatient Hospital Stay (HOSPITAL_COMMUNITY)
Admission: EM | Admit: 2023-08-28 | Discharge: 2023-08-30 | DRG: 420 | Disposition: A | Discharge status: Home / Self Care | Payer: 59 | Attending: General Surgery | Admitting: General Surgery

## 2023-08-28 DIAGNOSIS — K661 Hemoperitoneum: Secondary | ICD-10-CM | POA: Diagnosis present

## 2023-08-28 DIAGNOSIS — Z79899 Other long term (current) drug therapy: Secondary | ICD-10-CM | POA: Diagnosis not present

## 2023-08-28 DIAGNOSIS — S51811A Laceration without foreign body of right forearm, initial encounter: Secondary | ICD-10-CM | POA: Diagnosis present

## 2023-08-28 DIAGNOSIS — Z9221 Personal history of antineoplastic chemotherapy: Secondary | ICD-10-CM | POA: Diagnosis not present

## 2023-08-28 DIAGNOSIS — Z905 Acquired absence of kidney: Secondary | ICD-10-CM | POA: Diagnosis not present

## 2023-08-28 DIAGNOSIS — Z85528 Personal history of other malignant neoplasm of kidney: Secondary | ICD-10-CM | POA: Diagnosis not present

## 2023-08-28 DIAGNOSIS — Z23 Encounter for immunization: Secondary | ICD-10-CM

## 2023-08-28 DIAGNOSIS — W3400XA Accidental discharge from unspecified firearms or gun, initial encounter: Secondary | ICD-10-CM | POA: Diagnosis not present

## 2023-08-28 DIAGNOSIS — S36115A Moderate laceration of liver, initial encounter: Secondary | ICD-10-CM | POA: Diagnosis present

## 2023-08-28 LAB — CBC
HCT: 30.8 % — ABNORMAL LOW (ref 39.0–52.0)
Hemoglobin: 9.8 g/dL — ABNORMAL LOW (ref 13.0–17.0)
MCH: 29.6 pg (ref 26.0–34.0)
MCHC: 31.8 g/dL (ref 30.0–36.0)
MCV: 93.1 fL (ref 80.0–100.0)
Platelets: 133 10*3/uL — ABNORMAL LOW (ref 150–400)
RBC: 3.31 MIL/uL — ABNORMAL LOW (ref 4.22–5.81)
RDW: 13.4 % (ref 11.5–15.5)
WBC: 10.3 10*3/uL (ref 4.0–10.5)
nRBC: 0 % (ref 0.0–0.2)

## 2023-08-28 LAB — I-STAT CHEM 8, ED
BUN: 36 mg/dL — ABNORMAL HIGH (ref 6–20)
Calcium, Ion: 1.08 mmol/L — ABNORMAL LOW (ref 1.15–1.40)
Chloride: 107 mmol/L (ref 98–111)
Creatinine, Ser: 5.5 mg/dL — ABNORMAL HIGH (ref 0.61–1.24)
Glucose, Bld: 116 mg/dL — ABNORMAL HIGH (ref 70–99)
HCT: 30 % — ABNORMAL LOW (ref 39.0–52.0)
Hemoglobin: 10.2 g/dL — ABNORMAL LOW (ref 13.0–17.0)
Potassium: 3.5 mmol/L (ref 3.5–5.1)
Sodium: 138 mmol/L (ref 135–145)
TCO2: 19 mmol/L — ABNORMAL LOW (ref 22–32)

## 2023-08-28 LAB — SAMPLE TO BLOOD BANK

## 2023-08-28 LAB — I-STAT CG4 LACTIC ACID, ED: Lactic Acid, Venous: 2.2 mmol/L (ref 0.5–1.9)

## 2023-08-28 LAB — PROTIME-INR
INR: 0.9 (ref 0.8–1.2)
Prothrombin Time: 12.5 s (ref 11.4–15.2)

## 2023-08-28 LAB — ETHANOL: Alcohol, Ethyl (B): <10 mg/dL (ref <10)

## 2023-08-28 MED ORDER — LIDOCAINE-EPINEPHRINE (PF) 2 %-1:200000 IJ SOLN
INTRAMUSCULAR | Status: AC
  Administered 2023-08-28: 20 mL
  Filled 2023-08-28: qty 20

## 2023-08-28 MED ORDER — FENTANYL CITRATE PF 50 MCG/ML IJ SOSY: 50.0000 ug | PREFILLED_SYRINGE | Freq: Once | INTRAMUSCULAR | Status: DC

## 2023-08-28 MED ORDER — METHOCARBAMOL 1000 MG/10ML IJ SOLN
500.0000 mg | Freq: Three times a day (TID) | INTRAVENOUS | Status: DC
  Filled 2023-08-28: qty 5

## 2023-08-28 MED ORDER — ONDANSETRON HCL 4 MG/2ML IJ SOLN: 4.0000 mg | Freq: Four times a day (QID) | INTRAMUSCULAR | Status: DC | PRN

## 2023-08-28 MED ORDER — FENTANYL CITRATE PF 50 MCG/ML IJ SOSY: 100.0000 ug | PREFILLED_SYRINGE | Freq: Once | INTRAMUSCULAR | Status: AC

## 2023-08-28 MED ORDER — METOPROLOL TARTRATE 5 MG/5ML IV SOLN: 5.0000 mg | Freq: Four times a day (QID) | INTRAVENOUS | Status: DC | PRN

## 2023-08-28 MED ORDER — POLYETHYLENE GLYCOL 3350 17 G PO PACK: 17.0000 g | PACK | Freq: Every day | ORAL | Status: DC | PRN

## 2023-08-28 MED ORDER — HYDRALAZINE HCL 20 MG/ML IJ SOLN: 10.0000 mg | INTRAMUSCULAR | Status: DC | PRN

## 2023-08-28 MED ORDER — FENTANYL CITRATE PF 50 MCG/ML IJ SOSY
50.0000 ug | PREFILLED_SYRINGE | Freq: Once | INTRAMUSCULAR | Status: AC
  Administered 2023-08-28: 50 ug via INTRAVENOUS

## 2023-08-28 MED ORDER — METHOCARBAMOL 500 MG PO TABS
500.0000 mg | ORAL_TABLET | Freq: Three times a day (TID) | ORAL | Status: DC
  Administered 2023-08-28 – 2023-08-29 (×4): 500 mg via ORAL
  Filled 2023-08-28 (×5): qty 1

## 2023-08-28 MED ORDER — IOHEXOL 350 MG/ML SOLN
75.0000 mL | Freq: Once | INTRAVENOUS | Status: AC | PRN
  Administered 2023-08-28: 75 mL via INTRAVENOUS

## 2023-08-28 MED ORDER — MORPHINE SULFATE (PF) 4 MG/ML IV SOLN: 4.0000 mg | INTRAVENOUS | Status: DC | PRN

## 2023-08-28 MED ORDER — TETANUS-DIPHTH-ACELL PERTUSSIS 5-2.5-18.5 LF-MCG/0.5 IM SUSY
0.5000 mL | PREFILLED_SYRINGE | Freq: Once | INTRAMUSCULAR | Status: AC
  Administered 2023-08-28: 0.5 mL via INTRAMUSCULAR
  Filled 2023-08-28: qty 0.5

## 2023-08-28 MED ORDER — CEFAZOLIN SODIUM-DEXTROSE 2-4 GM/100ML-% IV SOLN
2.0000 g | Freq: Once | INTRAVENOUS | Status: AC
  Administered 2023-08-28: 2 g via INTRAVENOUS

## 2023-08-28 MED ORDER — ACETAMINOPHEN 500 MG PO TABS
1000.0000 mg | ORAL_TABLET | Freq: Four times a day (QID) | ORAL | Status: DC
  Administered 2023-08-28 – 2023-08-29 (×3): 1000 mg via ORAL
  Filled 2023-08-28 (×4): qty 2

## 2023-08-28 MED ORDER — DOCUSATE SODIUM 100 MG PO CAPS
100.0000 mg | ORAL_CAPSULE | Freq: Two times a day (BID) | ORAL | Status: DC
  Administered 2023-08-29 (×2): 100 mg via ORAL
  Filled 2023-08-28 (×2): qty 1

## 2023-08-28 MED ORDER — ENOXAPARIN SODIUM 30 MG/0.3ML IJ SOSY
30.0000 mg | PREFILLED_SYRINGE | Freq: Two times a day (BID) | INTRAMUSCULAR | Status: DC
  Administered 2023-08-29: 30 mg via SUBCUTANEOUS
  Filled 2023-08-28: qty 0.3

## 2023-08-28 MED ORDER — OXYCODONE HCL 5 MG PO TABS
5.0000 mg | ORAL_TABLET | ORAL | Status: DC | PRN
  Administered 2023-08-29 – 2023-08-30 (×2): 10 mg via ORAL
  Filled 2023-08-28 (×2): qty 2

## 2023-08-28 MED ORDER — ONDANSETRON 4 MG PO TBDP: 4.0000 mg | ORAL_TABLET | Freq: Four times a day (QID) | ORAL | Status: DC | PRN

## 2023-08-28 MED ORDER — FENTANYL CITRATE PF 50 MCG/ML IJ SOSY
PREFILLED_SYRINGE | INTRAMUSCULAR | Status: AC
  Administered 2023-08-28: 50 ug via INTRAVENOUS
  Filled 2023-08-28: qty 2
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
## 2023-08-28 NOTE — Progress Notes (Signed)
Orthopedic Tech Progress Note Patient Details:  [name] 1986/05/27 [id number]  Patient ID: [name], male   DOB: 1986-06-08, 37 y.o.   MRN: [id number] I attended trauma page. Trinna Post 08/28/2023, 11:18 PM
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
## 2023-08-29 ENCOUNTER — Inpatient Hospital Stay (HOSPITAL_COMMUNITY): Payer: 59 | Admitting: Anesthesiology

## 2023-08-29 ENCOUNTER — Encounter (HOSPITAL_COMMUNITY): Admission: EM | Disposition: A | Discharge status: Home / Self Care | Payer: Self-pay | Source: Home / Self Care

## 2023-08-29 DIAGNOSIS — S36115A Moderate laceration of liver, initial encounter: Secondary | ICD-10-CM | POA: Diagnosis not present

## 2023-08-29 HISTORY — PX: LAPAROSCOPY: SHX197

## 2023-08-29 LAB — COMPREHENSIVE METABOLIC PANEL
ALT: 39 U/L (ref 0–44)
AST: 50 U/L — ABNORMAL HIGH (ref 15–41)
Albumin: 3.2 g/dL — ABNORMAL LOW (ref 3.5–5.0)
Alkaline Phosphatase: 75 U/L (ref 38–126)
Anion gap: 15 (ref 5–15)
BUN: 36 mg/dL — ABNORMAL HIGH (ref 6–20)
CO2: 19 mmol/L — ABNORMAL LOW (ref 22–32)
Calcium: 8.6 mg/dL — ABNORMAL LOW (ref 8.9–10.3)
Chloride: 103 mmol/L (ref 98–111)
Creatinine, Ser: 4.9 mg/dL — ABNORMAL HIGH (ref 0.61–1.24)
GFR, Estimated: 15 mL/min — ABNORMAL LOW (ref >60)
Glucose, Bld: 124 mg/dL — ABNORMAL HIGH (ref 70–99)
Potassium: 3.6 mmol/L (ref 3.5–5.1)
Sodium: 137 mmol/L (ref 135–145)
Total Bilirubin: 0.3 mg/dL (ref 0.3–1.2)
Total Protein: 6.1 g/dL — ABNORMAL LOW (ref 6.5–8.1)

## 2023-08-29 LAB — HIV ANTIBODY (ROUTINE TESTING W REFLEX): HIV Screen 4th Generation wRfx: NONREACTIVE

## 2023-08-29 LAB — BASIC METABOLIC PANEL
Anion gap: 9 (ref 5–15)
BUN: 33 mg/dL — ABNORMAL HIGH (ref 6–20)
CO2: 21 mmol/L — ABNORMAL LOW (ref 22–32)
Calcium: 8.4 mg/dL — ABNORMAL LOW (ref 8.9–10.3)
Chloride: 102 mmol/L (ref 98–111)
Creatinine, Ser: 4.48 mg/dL — ABNORMAL HIGH (ref 0.61–1.24)
GFR, Estimated: 16 mL/min — ABNORMAL LOW (ref >60)
Glucose, Bld: 115 mg/dL — ABNORMAL HIGH (ref 70–99)
Potassium: 4.2 mmol/L (ref 3.5–5.1)
Sodium: 132 mmol/L — ABNORMAL LOW (ref 135–145)

## 2023-08-29 LAB — CBC
HCT: 31 % — ABNORMAL LOW (ref 39.0–52.0)
Hemoglobin: 10 g/dL — ABNORMAL LOW (ref 13.0–17.0)
MCH: 29.7 pg (ref 26.0–34.0)
MCHC: 32.3 g/dL (ref 30.0–36.0)
MCV: 92 fL (ref 80.0–100.0)
Platelets: 124 10*3/uL — ABNORMAL LOW (ref 150–400)
RBC: 3.37 MIL/uL — ABNORMAL LOW (ref 4.22–5.81)
RDW: 13.6 % (ref 11.5–15.5)
WBC: 11.8 10*3/uL — ABNORMAL HIGH (ref 4.0–10.5)
nRBC: 0 % (ref 0.0–0.2)

## 2023-08-29 LAB — HEMOGLOBIN AND HEMATOCRIT, BLOOD
HCT: 27.7 % — ABNORMAL LOW (ref 39.0–52.0)
Hemoglobin: 9.1 g/dL — ABNORMAL LOW (ref 13.0–17.0)

## 2023-08-29 SURGERY — LAPAROSCOPY, DIAGNOSTIC
Anesthesia: General

## 2023-08-29 MED ORDER — OXYCODONE HCL 5 MG PO TABS: 5.0000 mg | ORAL_TABLET | Freq: Once | ORAL | Status: DC | PRN

## 2023-08-29 MED ORDER — BUPIVACAINE HCL (PF) 0.25 % IJ SOLN
INTRAMUSCULAR | Status: AC
  Filled 2023-08-29: qty 30

## 2023-08-29 MED ORDER — PROPOFOL 10 MG/ML IV BOLUS
INTRAVENOUS | Status: DC | PRN
  Administered 2023-08-29: 150 mg via INTRAVENOUS

## 2023-08-29 MED ORDER — FENTANYL CITRATE (PF) 100 MCG/2ML IJ SOLN: 25.0000 ug | INTRAMUSCULAR | Status: DC | PRN

## 2023-08-29 MED ORDER — ONDANSETRON HCL 4 MG/2ML IJ SOLN
INTRAMUSCULAR | Status: DC | PRN
  Administered 2023-08-29: 4 mg via INTRAVENOUS

## 2023-08-29 MED ORDER — ACETAMINOPHEN 10 MG/ML IV SOLN: 1000.0000 mg | Freq: Once | INTRAVENOUS | Status: DC | PRN

## 2023-08-29 MED ORDER — PHENYLEPHRINE HCL (PRESSORS) 10 MG/ML IV SOLN
INTRAVENOUS | Status: DC | PRN
Start: 2023-08-29 — End: 2023-08-29
  Administered 2023-08-29: 80 ug via INTRAVENOUS

## 2023-08-29 MED ORDER — PROPOFOL 10 MG/ML IV BOLUS
INTRAVENOUS | Status: AC
  Filled 2023-08-29: qty 20

## 2023-08-29 MED ORDER — OXYCODONE HCL 5 MG/5ML PO SOLN: 5.0000 mg | Freq: Once | ORAL | Status: DC | PRN

## 2023-08-29 MED ORDER — SODIUM CHLORIDE 0.9 % IV SOLN
INTRAVENOUS | Status: DC | PRN
Start: 2023-08-29 — End: 2023-08-29

## 2023-08-29 MED ORDER — FENTANYL CITRATE (PF) 250 MCG/5ML IJ SOLN
INTRAMUSCULAR | Status: AC
  Filled 2023-08-29: qty 5

## 2023-08-29 MED ORDER — MIDAZOLAM HCL 2 MG/2ML IJ SOLN
INTRAMUSCULAR | Status: AC
  Filled 2023-08-29: qty 2

## 2023-08-29 MED ORDER — LIDOCAINE HCL (CARDIAC) PF 50 MG/5ML IV SOSY
PREFILLED_SYRINGE | INTRAVENOUS | Status: DC | PRN
  Administered 2023-08-29: 60 mg via INTRAVENOUS

## 2023-08-29 MED ORDER — DEXAMETHASONE SODIUM PHOSPHATE 4 MG/ML IJ SOLN
INTRAMUSCULAR | Status: DC | PRN
  Administered 2023-08-29: 5 mg via INTRAVENOUS

## 2023-08-29 MED ORDER — SUCCINYLCHOLINE CHLORIDE 20 MG/ML IJ SOLN
INTRAMUSCULAR | Status: DC | PRN
  Administered 2023-08-29: 60 mg via INTRAVENOUS

## 2023-08-29 MED ORDER — BUPIVACAINE LIPOSOME 1.3 % IJ SUSP
INTRAMUSCULAR | Status: DC | PRN
  Administered 2023-08-29: 15 mL

## 2023-08-29 MED ORDER — BUPIVACAINE LIPOSOME 1.3 % IJ SUSP
INTRAMUSCULAR | Status: AC
  Filled 2023-08-29: qty 20

## 2023-08-29 MED ORDER — BUPIVACAINE HCL 0.25 % IJ SOLN
INTRAMUSCULAR | Status: DC | PRN
  Administered 2023-08-29: 15 mL

## 2023-08-29 MED ORDER — FENTANYL CITRATE (PF) 100 MCG/2ML IJ SOLN
INTRAMUSCULAR | Status: DC | PRN
  Administered 2023-08-29 (×4): 50 ug via INTRAVENOUS

## 2023-08-29 MED ORDER — MIDAZOLAM HCL 5 MG/5ML IJ SOLN
INTRAMUSCULAR | Status: DC | PRN
  Administered 2023-08-29: 2 mg via INTRAVENOUS

## 2023-08-29 MED ORDER — ENOXAPARIN SODIUM 30 MG/0.3ML IJ SOSY: 30.0000 mg | PREFILLED_SYRINGE | Freq: Two times a day (BID) | INTRAMUSCULAR | Status: DC

## 2023-08-29 MED ORDER — ROCURONIUM BROMIDE 100 MG/10ML IV SOLN
INTRAVENOUS | Status: DC | PRN
  Administered 2023-08-29: 50 mg via INTRAVENOUS

## 2023-08-29 MED ORDER — SUGAMMADEX SODIUM 200 MG/2ML IV SOLN
INTRAVENOUS | Status: DC | PRN
Start: 2023-08-29 — End: 2023-08-29
  Administered 2023-08-29: 250 mg via INTRAVENOUS

## 2023-08-29 SURGICAL SUPPLY — 33 items
ADH SKN CLS APL DERMABOND .7 (GAUZE/BANDAGES/DRESSINGS) ×1
ADH SKN CLS LQ APL DERMABOND (GAUZE/BANDAGES/DRESSINGS) ×1
APL PRP STRL LF DISP 70% ISPRP (MISCELLANEOUS) ×1
BAG COUNTER SPONGE SURGICOUNT (BAG) ×1 IMPLANT
BAG SPNG CNTER NS LX DISP (BAG) ×1
BLADE CLIPPER SURG (BLADE) IMPLANT
CANISTER SUCT 3000ML PPV (MISCELLANEOUS) IMPLANT
CHLORAPREP W/TINT 26 (MISCELLANEOUS) ×1 IMPLANT
COVER SURGICAL LIGHT HANDLE (MISCELLANEOUS) ×1 IMPLANT
DERMABOND ADVANCED .7 DNX12 (GAUZE/BANDAGES/DRESSINGS) IMPLANT
DERMABOND ADVANCED .7 DNX6 (GAUZE/BANDAGES/DRESSINGS) ×1 IMPLANT
ELECT REM PT RETURN 9FT ADLT (ELECTROSURGICAL) ×1
ELECTRODE REM PT RTRN 9FT ADLT (ELECTROSURGICAL) ×1 IMPLANT
GLOVE BIO SURGEON STRL SZ 6.5 (GLOVE) ×1 IMPLANT
GLOVE BIOGEL PI IND STRL 6 (GLOVE) ×1 IMPLANT
GOWN STRL REUS W/ TWL LRG LVL3 (GOWN DISPOSABLE) ×2 IMPLANT
GOWN STRL REUS W/TWL LRG LVL3 (GOWN DISPOSABLE) ×2
IRRIG SUCT STRYKERFLOW 2 WTIP (MISCELLANEOUS)
IRRIGATION SUCT STRKRFLW 2 WTP (MISCELLANEOUS) IMPLANT
KIT BASIN OR (CUSTOM PROCEDURE TRAY) ×1 IMPLANT
KIT TURNOVER KIT B (KITS) ×1 IMPLANT
NS IRRIG 1000ML POUR BTL (IV SOLUTION) ×1 IMPLANT
PAD ARMBOARD 7.5X6 YLW CONV (MISCELLANEOUS) ×2 IMPLANT
SCISSORS LAP 5X35 DISP (ENDOMECHANICALS) IMPLANT
SET TUBE SMOKE EVAC HIGH FLOW (TUBING) ×1 IMPLANT
SLEEVE Z-THREAD 5X100MM (TROCAR) ×1 IMPLANT
SUT MNCRL AB 4-0 PS2 18 (SUTURE) ×1 IMPLANT
TOWEL GREEN STERILE (TOWEL DISPOSABLE) ×1 IMPLANT
TOWEL GREEN STERILE FF (TOWEL DISPOSABLE) ×1 IMPLANT
TRAY LAPAROSCOPIC MC (CUSTOM PROCEDURE TRAY) ×1 IMPLANT
TROCAR BALLN 12MMX100 BLUNT (TROCAR) IMPLANT
TROCAR Z-THREAD OPTICAL 5X100M (TROCAR) ×1 IMPLANT
WARMER LAPAROSCOPE (MISCELLANEOUS) ×1 IMPLANT
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
## 2023-08-29 NOTE — TOC CAGE-AID Note (Signed)
Transition of Care [facility]) - CAGE-AID Screening  Patient Details  Name: [name] MRN: [id number] Date of Birth: 05-08-1986  Clinical Narrative:  Patient endorses both drinking and drug (marijuana) use. Education offered but patient denies need for substance abuse resources at this time  CAGE-AID Screening:    Have You Ever Felt You Ought to Cut Down on Your Drinking or Drug Use?: Yes Have People Annoyed You By [employer] Your Drinking Or Drug Use?: Yes Have You Felt Bad Or Guilty About Your Drinking Or Drug Use?: Yes Have You Ever Had a Drink or Used Drugs First Thing In The Morning to Steady Your Nerves or to Get Rid of a Hangover?: No CAGE-AID Score: 3  Substance Abuse Education Offered: No
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
## 2023-08-29 NOTE — Anesthesia Procedure Notes (Signed)
Procedure Name: Intubation Date/Time: 08/29/2023 1:45 AM  Performed by: Edmonia Caprio, CRNAPre-anesthesia Checklist: Patient identified, Emergency Drugs available, Suction available, Patient being monitored and Timeout performed Patient Re-evaluated:Patient Re-evaluated prior to induction Oxygen Delivery Method: Circle system utilized Preoxygenation: Pre-oxygenation with 100% oxygen Induction Type: IV induction and Rapid sequence Laryngoscope Size: Miller and 2 Grade View: Grade I Tube type: Oral Tube size: 7.5 mm Number of attempts: 1 Airway Equipment and Method: Stylet Placement Confirmation: ETT inserted through vocal cords under direct vision, breath sounds checked- equal and bilateral and positive ETCO2 Secured at: 21 cm Tube secured with: Tape Dental Injury: Teeth and Oropharynx as per pre-operative assessment

## 2023-08-29 NOTE — ED Notes (Signed)
Transported to OR

## 2023-08-29 NOTE — Transfer of Care (Signed)
Immediate Anesthesia Transfer of Care Note  Patient: [name]  Procedure(s) Performed: LAPAROSCOPY DIAGNOSTIC, POSSIBLE EXPLORATORY LAPAROTOMY  Patient Location: PACU  Anesthesia Type:General  Level of Consciousness: awake, alert , and oriented  Airway & Oxygen Therapy: Patient Spontanous Breathing and Patient connected to nasal cannula oxygen  Post-op Assessment: Report given to RN and Post -op Vital signs reviewed and stable  Post vital signs: Reviewed and stable  Last Vitals:  Vitals Value Taken Time  BP 150/92 08/29/23 0252  Temp    Pulse 60 08/29/23 0253  Resp 24 08/29/23 0253  SpO2 99 % 08/29/23 0253  Vitals shown include unfiled device data.  Last Pain:  Vitals:   08/28/23 2334  TempSrc: Oral  PainSc:          Complications: No notable events documented.
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
## 2023-08-29 NOTE — Anesthesia Postprocedure Evaluation (Signed)
Anesthesia Post Note  Patient: [name]  Procedure(s) Performed: LAPAROSCOPY DIAGNOSTIC, POSSIBLE EXPLORATORY LAPAROTOMY     Patient location during evaluation: PACU Anesthesia Type: General Level of consciousness: awake Pain management: pain level controlled Vital Signs Assessment: post-procedure vital signs reviewed and stable Respiratory status: spontaneous breathing, nonlabored ventilation and respiratory function stable Cardiovascular status: blood pressure returned to baseline and stable Postop Assessment: no apparent nausea or vomiting Anesthetic complications: no   No notable events documented.  Last Vitals:  Vitals:   08/29/23 0334 08/29/23 0350  BP: 134/66 134/68  Pulse: (!) 56 (!) 56  Resp: 19 16  Temp: 36.9 C 36.7 C  SpO2: 95% 98%    Last Pain:  Vitals:   08/29/23 0350  TempSrc: Oral  PainSc: Asleep                 Shree Espey P Ismerai Bin

## 2023-08-30 ENCOUNTER — Encounter (HOSPITAL_COMMUNITY): Payer: Self-pay | Admitting: Surgery

## 2023-08-30 ENCOUNTER — Other Ambulatory Visit (HOSPITAL_COMMUNITY): Payer: Self-pay

## 2023-08-30 LAB — BASIC METABOLIC PANEL
Anion gap: 9 (ref 5–15)
BUN: 31 mg/dL — ABNORMAL HIGH (ref 6–20)
CO2: 22 mmol/L (ref 22–32)
Calcium: 9 mg/dL (ref 8.9–10.3)
Chloride: 104 mmol/L (ref 98–111)
Creatinine, Ser: 4.37 mg/dL — ABNORMAL HIGH (ref 0.61–1.24)
GFR, Estimated: 17 mL/min — ABNORMAL LOW (ref >60)
Glucose, Bld: 92 mg/dL (ref 70–99)
Potassium: 3.9 mmol/L (ref 3.5–5.1)
Sodium: 135 mmol/L (ref 135–145)

## 2023-08-30 LAB — CBC
HCT: 27.7 % — ABNORMAL LOW (ref 39.0–52.0)
Hemoglobin: 9.1 g/dL — ABNORMAL LOW (ref 13.0–17.0)
MCH: 29.2 pg (ref 26.0–34.0)
MCHC: 32.9 g/dL (ref 30.0–36.0)
MCV: 88.8 fL (ref 80.0–100.0)
Platelets: 112 10*3/uL — ABNORMAL LOW (ref 150–400)
RBC: 3.12 MIL/uL — ABNORMAL LOW (ref 4.22–5.81)
RDW: 13.5 % (ref 11.5–15.5)
WBC: 10.6 10*3/uL — ABNORMAL HIGH (ref 4.0–10.5)
nRBC: 0 % (ref 0.0–0.2)

## 2023-08-30 MED ORDER — POLYETHYLENE GLYCOL 3350 17 GM/SCOOP PO POWD
17.0000 g | Freq: Every day | ORAL | 0 refills | Status: AC | PRN
  Filled 2023-08-30: qty 238, 14d supply, fill #0

## 2023-08-30 MED ORDER — DOCUSATE SODIUM 100 MG PO CAPS
100.0000 mg | ORAL_CAPSULE | Freq: Two times a day (BID) | ORAL | 0 refills | Status: AC
  Filled 2023-08-30: qty 10, 5d supply, fill #0

## 2023-08-30 MED ORDER — ACETAMINOPHEN 500 MG PO TABS
1000.0000 mg | ORAL_TABLET | Freq: Four times a day (QID) | ORAL | 0 refills | Status: AC
  Filled 2023-08-30: qty 30, 4d supply, fill #0

## 2023-08-30 MED ORDER — OXYCODONE HCL 5 MG PO TABS
5.0000 mg | ORAL_TABLET | ORAL | 0 refills | Status: AC | PRN
Start: 2023-08-30 — End: ?
  Filled 2023-08-30: qty 25, 5d supply, fill #0

## 2023-08-30 MED ORDER — METHOCARBAMOL 500 MG PO TABS
500.0000 mg | ORAL_TABLET | Freq: Four times a day (QID) | ORAL | 0 refills | Status: AC | PRN
  Filled 2023-08-30: qty 40, 10d supply, fill #0
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
## 2023-08-30 NOTE — Discharge Instructions (Signed)
Make an appointment as soon as possible to re-establish care with your nephrologist and to schedule and MRI of your right kidney.    LAPAROSCOPIC SURGERY: POST OP INSTRUCTIONS Always review your discharge instruction sheet given to you by the facility where your surgery was performed. IF YOU HAVE DISABILITY OR FAMILY LEAVE FORMS, YOU MUST BRING THEM TO THE OFFICE FOR PROCESSING.   DO NOT GIVE THEM TO YOUR DOCTOR.  PAIN CONTROL  First take acetaminophen (Tylenol) to control your pain after surgery.  Follow directions on package.  Taking acetaminophen (Tylenol) regularly after surgery will help to control your pain and lower the amount of prescription pain medication you may need.  You should not take more than 4,000 mg (3 grams) of acetaminophen (Tylenol) in 24 hours.  You should not take ibuprofen (Advil), aleve, motrin, naprosyn or other NSAIDS due to your history of kidney disease A prescription for pain medication may be given to you upon discharge.  Take your pain medication as prescribed, if you still have uncontrolled pain after taking acetaminophen (Tylenol)  Use ice packs to help control pain. If you need a refill on your pain medication, please contact your pharmacy.  They will contact our office to request authorization. Prescriptions will not be filled after 5pm or on week-ends.  HOME MEDICATIONS Take your usually prescribed medications unless otherwise directed.  DIET You should follow a light diet the first few days after arrival home.  Be sure to include lots of fluids daily. Avoid fatty, fried foods.   CONSTIPATION It is common to experience some constipation after surgery and if you are taking pain medication.  Increasing fluid intake and taking a stool softener (such as Colace) will usually help or prevent this problem from occurring.  A mild laxative (Milk of Magnesia or Miralax) should be taken according to package instructions if there are no bowel movements after 48  hours.  WOUND/INCISION CARE Most patients will experience some swelling and bruising in the area of the incisions.  Ice packs will help.  Swelling and bruising can take several days to resolve.  Unless discharge instructions indicate otherwise, follow guidelines below  STERI-STRIPS - you may remove your outer bandages 48 hours after surgery, and you may shower at that time.  You have steri-strips (small skin tapes) in place directly over the incision.  These strips should be left on the skin for 7-10 days.   DERMABOND/SKIN GLUE - you may shower in 24 hours.  The glue will flake off over the next 2-3 weeks. Any sutures or staples will be removed at the office during your follow-up visit.  ACTIVITIES You may resume regular (light) daily activities beginning the next day--such as daily self-care, walking, climbing stairs--gradually increasing activities as tolerated.  You may have sexual intercourse when it is comfortable.  Refrain from any heavy lifting or straining until approved by your doctor. You may drive when you are no longer taking prescription pain medication, you can comfortably wear a seatbelt, and you can safely maneuver your car and apply brakes.  FOLLOW-UP You should see your doctor in the office for a follow-up appointment approximately 2-3 weeks after your surgery.  You should have been given your post-op/follow-up appointment when your surgery was scheduled.  If you did not receive a post-op/follow-up appointment, make sure that you call for this appointment within a day or two after you arrive home to insure a convenient appointment time.  OTHER INSTRUCTIONS   WHEN TO CALL YOUR DOCTOR: Fever  over 101.0 Inability to urinate Continued bleeding from incision. Increased pain, redness, or drainage from the incision. Increasing abdominal pain  The clinic staff is available to answer your questions during regular business hours.  Please don't hesitate to call and ask to speak to  one of the nurses for clinical concerns.  If you have a medical emergency, go to the nearest emergency room or call 911.  A surgeon from [facility] is always on call at the hospital. [address], [address], [place], [place]  [address] ? [address] [address], [place], [place]   [address] [phone] ? [phone] ? FAX [phone] Web site: [email or web address]

## 2023-08-30 NOTE — Final Progress Note (Signed)
Discharge instructions (including medications) discussed with and copy provided to patient/caregiver 

## 2023-08-30 NOTE — Plan of Care (Signed)
